# Patient Record
Sex: Male | Born: 1980 | Hispanic: Yes | Marital: Married | Smoking: Never smoker
Health system: Southern US, Community
[De-identification: ages and names within clinical notes are randomized; demographics above are authoritative.]

---

## 2014-05-18 ENCOUNTER — Encounter (HOSPITAL_COMMUNITY): Payer: Self-pay | Admitting: Family Medicine

## 2014-05-18 ENCOUNTER — Emergency Department (HOSPITAL_COMMUNITY)
Admission: EM | Admit: 2014-05-18 | Discharge: 2014-05-18 | Disposition: A | Discharge status: Home / Self Care | Payer: Self-pay | Attending: Emergency Medicine | Admitting: Emergency Medicine

## 2014-05-18 DIAGNOSIS — R1110 Vomiting, unspecified: Secondary | ICD-10-CM | POA: Insufficient documentation

## 2014-05-18 DIAGNOSIS — R197 Diarrhea, unspecified: Secondary | ICD-10-CM | POA: Insufficient documentation

## 2014-05-18 LAB — COMPREHENSIVE METABOLIC PANEL
ALT: 65 U/L — ABNORMAL HIGH (ref 0–53)
AST: 50 U/L — AB (ref 0–37)
Albumin: 4.3 g/dL (ref 3.5–5.2)
Alkaline Phosphatase: 89 U/L (ref 39–117)
Anion gap: 6 (ref 5–15)
BUN: 15 mg/dL (ref 6–23)
CALCIUM: 9.3 mg/dL (ref 8.4–10.5)
CO2: 26 mmol/L (ref 19–32)
Chloride: 105 mEq/L (ref 96–112)
Creatinine, Ser: 0.89 mg/dL (ref 0.50–1.35)
GFR calc Af Amer: >90 mL/min (ref >90)
GFR calc non Af Amer: >90 mL/min (ref >90)
Glucose, Bld: 102 mg/dL — ABNORMAL HIGH (ref 70–99)
Potassium: 4 mmol/L (ref 3.5–5.1)
SODIUM: 137 mmol/L (ref 135–145)
TOTAL PROTEIN: 7.4 g/dL (ref 6.0–8.3)
Total Bilirubin: 0.5 mg/dL (ref 0.3–1.2)

## 2014-05-18 LAB — CBC WITH DIFFERENTIAL/PLATELET
Basophils Absolute: 0 10*3/uL (ref 0.0–0.1)
Basophils Relative: 0 % (ref 0–1)
EOS ABS: 0.1 10*3/uL (ref 0.0–0.7)
EOS PCT: 2 % (ref 0–5)
HCT: 44 % (ref 39.0–52.0)
Hemoglobin: 15.3 g/dL (ref 13.0–17.0)
LYMPHS PCT: 35 % (ref 12–46)
Lymphs Abs: 1.9 10*3/uL (ref 0.7–4.0)
MCH: 32.6 pg (ref 26.0–34.0)
MCHC: 34.8 g/dL (ref 30.0–36.0)
MCV: 93.8 fL (ref 78.0–100.0)
Monocytes Absolute: 0.5 10*3/uL (ref 0.1–1.0)
Monocytes Relative: 10 % (ref 3–12)
NEUTROS PCT: 53 % (ref 43–77)
Neutro Abs: 2.9 10*3/uL (ref 1.7–7.7)
PLATELETS: 167 10*3/uL (ref 150–400)
RBC: 4.69 MIL/uL (ref 4.22–5.81)
RDW: 12.3 % (ref 11.5–15.5)
WBC: 5.4 10*3/uL (ref 4.0–10.5)

## 2014-05-18 LAB — LIPASE, BLOOD: Lipase: 37 U/L (ref 11–59)

## 2014-05-18 LAB — URINALYSIS, ROUTINE W REFLEX MICROSCOPIC
Bilirubin Urine: NEGATIVE
Glucose, UA: NEGATIVE mg/dL
Ketones, ur: NEGATIVE mg/dL
LEUKOCYTES UA: NEGATIVE
Nitrite: NEGATIVE
Protein, ur: NEGATIVE mg/dL
SPECIFIC GRAVITY, URINE: 1.023 (ref 1.005–1.030)
Urobilinogen, UA: 0.2 mg/dL (ref 0.0–1.0)
pH: 7 (ref 5.0–8.0)

## 2014-05-18 LAB — URINE MICROSCOPIC-ADD ON

## 2014-05-18 MED ORDER — MORPHINE SULFATE 4 MG/ML IJ SOLN
4.0000 mg | Freq: Once | INTRAMUSCULAR | Status: AC
  Administered 2014-05-18: 4 mg via INTRAVENOUS
  Filled 2014-05-18: qty 1

## 2014-05-18 MED ORDER — SODIUM CHLORIDE 0.9 % IV BOLUS (SEPSIS)
1000.0000 mL | Freq: Once | INTRAVENOUS | Status: AC
  Administered 2014-05-18: 1000 mL via INTRAVENOUS

## 2014-05-18 NOTE — Discharge Instructions (Signed)
Drink plenty of fluids. Call for a follow up appointment with a Family or Primary Care Provider.  Return if Symptoms worsen.   Take medication as prescribed.    Emergency Department Resource Guide 1) Find a Doctor and Pay Out of Pocket Although you won't have to find out who is covered by your insurance plan, it is a good idea to ask around and get recommendations. You will then need to call the office and see if the doctor you have chosen will accept you as a new patient and what types of options they offer for patients who are self-pay. Some doctors offer discounts or will set up payment plans for their patients who do not have insurance, but you will need to ask so you aren't surprised when you get to your appointment.  2) Contact Your Local Health Department Not all health departments have doctors that can see patients for sick visits, but many do, so it is worth a call to see if yours does. If you don't know where your local health department is, you can check in your phone book. The CDC also has a tool to help you locate your state's health department, and many state websites also have listings of all of their local health departments.  3) Find a Walk-in Clinic If your illness is not likely to be very severe or complicated, you may want to try a walk in clinic. These are popping up all over the country in pharmacies, drugstores, and shopping centers. They're usually staffed by nurse practitioners or physician assistants that have been trained to treat common illnesses and complaints. They're usually fairly quick and inexpensive. However, if you have serious medical issues or chronic medical problems, these are probably not your best option.  No Primary Care Doctor: - Call Health Connect at  [phone] - they can help you locate a primary care doctor that  accepts your insurance, provides certain services, etc. - Physician Referral Service- [phone]  Chronic Pain  Problems: Organization         Address  Phone   Notes  [facility]  [phone] Patients need to be referred by their primary care doctor.   Medication Assistance: Organization         Address  Phone   Notes  [facility] [address] [place], [place] [address] [phone]) [phone] --Must be a resident of [facility] [place] -- Must have NO insurance coverage whatsoever (no Medicaid/ Medicare, etc.) -- The pt. MUST have a primary care doctor that directs their care regularly and follows them in the community   MedAssist  [phone]   [employer]  [phone]) [phone]    Agencies that provide inexpensive medical care: Organization         Address  Phone   Notes  [facility]  [phone]   [facility]    [phone]) [phone]   [facility] [address] [place], [place] [address] [phone]) [phone]   [facility] [place] [address], [place] [phone]   [facility]    [phone](336) [phone]   [facility]    [phone](336) [phone]   [facility]  [address],  [place] Phone:  [phone], Fax:  [phone] Hours of Operation:  9 am - 6 pm, M-F.  Also accepts Medicaid/Medicare and self-pay.  [facility]  [address]  [place], [place], [place] Phone: [phone], Fax: [phone]. Hours of Operation:  8:30 am - 5:30 pm, M-F.  Also accepts Medicaid and self-pay.  [facility] [place] [address], [place] Phone: [phone]   [place], [place], [place] [phone], Ext. [phone] Mondays & Thursdays: 7-9 AM.  First 15 patients are seen on a first come, first serve basis.    [place] Providers:  Organization         Address  Phone   Notes  [facility] [address], [place] [phone] Also  accepts self-pay patients.  [facility] [address], [place]  [phone]   [place], [address], [place] [phone]   [facility] [address] [place] [phone]   Lucianne Lei [address], [place]   [phone] Only accepts [place] Access [place] patients after they have their name applied to their card.   Self-Pay (no insurance) in [facility]:  Organization         Address  Phone   Notes  Sickle Cell Patients, [facility] [place] [phone]   [facility] [place] [phone]   [facility] [place]  [place], [place],  [phone]   [facility]/Dr. Osei-Bonsu  [address] [place] or [address], [place] [phone] Phone number for both [place] and [place] locations is the same.  [facility] [address], [place] [phone]   [facility] [address] [place] or [address] [phone]   [facility] [address], [place] [phone], phone; [phone], fax Sees patients 1st and 3rd Saturday of every month.  Must not qualify for public or private insurance (i.e. Medicaid, Medicare, [place] Health Choice, Veterans' Benefits)  Household income should be no more than 200% of the poverty level The clinic cannot treat you if you are pregnant or think you are pregnant  Sexually transmitted diseases are not treated at the clinic.    Dental Care: Organization         Address  Phone  Notes  [facility] [place] [phone] Accepts children up to age 34 who are enrolled in [place] or [place]; pregnant  women with a Medicaid card; and children who have applied for Medicaid or [place] Health Choice, but were declined, whose parents can pay a reduced fee at time of service.  [facility]  [address], [place] [phone] Accepts children up to age 24 who are enrolled in [place] or [place]; pregnant women with a Medicaid card; and children who have applied for Medicaid or [place] Health Choice, but were declined, whose parents can pay a reduced fee at time of service.  [facility]  [place] [phone] Patients are seen by appointment only. Walk-ins are not accepted. [place] will see patients 57 years of age and older. Monday - Tuesday (8am-5pm) Most Wednesdays (8:30-5pm) $30 per visit, cash only  [place]  [address], [place] [phone] Patients are seen by appointment only. Walk-ins are not accepted. [facility] will see patients 18 years of age and older. °One Wednesday Evening (Monthly: Volunteer Based).  $30 per visit, cash only  °[facility]  [phone] for adults; Children under age 4, call Graduate Pediatric Dentistry at [phone]. Children aged 4-14, please call [phone] to request a pediatric application. ° Dental services are provided in all areas of dental care including fillings, crowns and bridges, complete and partial dentures, implants, gum treatment, root canals, and extractions. Preventive care is also provided. Treatment is provided to both adults and children. °Patients are selected via a lottery and there is often a waiting list. °  °[facility] [address], °[place] ° ([phone] [email or web address] °  °[facility] [address], [place], [place] [phone], Ext. [phone] Second and Fourth Thursday of each month, opens at 6:30 AM; Clinic ends at 9 AM.  Patients are  seen on a first-come first-served basis, and a limited number are seen during each clinic.  ° °[facility] ° [address], [place], [place] [phone]   Eligibility Requirements °You must have lived in [place], [place], or [place] counties for at least the last three months. °  You cannot be eligible for state or federal sponsored healthcare insurance, including Veterans Administration, Medicaid, or Medicare. °  You generally cannot be eligible for healthcare insurance through your employer.  °  How to apply: °Eligibility screenings are held every Tuesday and Wednesday afternoon from 1:00 pm until 4:00 pm. You do not need an appointment for the interview!  °[facility] [address], [place], [place] [phone]   °[place] [facility]  [phone]   °[place] [facility]  [phone]   °[facility]  336-570-6415   ° °Behavioral Health Resources in the Community: °Intensive Outpatient Programs °Organization         Address  Phone  Notes  °[facility] [address], [place], [place] [phone]   °[facility] [address], [place], [place] [phone]   °[facility] [address], [place], [place] ° 336-882-2125   °[facility] [address],  °[place], [place] 1-800-853-5163 or 336-641-4981   °Substance Abuse Resources °Organization         Address  Phone  Notes  °[facility]  336-882-2125   °[facility]  336-784-9470   °[facility]  336-285-9073   °[facility]  336-845-3988   °Residential & Outpatient Substance Abuse Program  1-800-659-3381   °Psychological Services °Organization         Address  Phone  Notes  ° Health  336- 832-9600   °[facility]  336- 378-7881   °[facility] [address], [place] 1-800-853-5163 or 336-641-4981   ° °Mobile Crisis  Teams °Organization         Address  Phone  Notes  °[facility], Mobile Crisis Care Unit  1-877-626-1772   °Assertive °Psychotherapeutic Services ° [address] [place], [place] 336-834-9664   °Sharon DeEsch [address] °[place] 336-554-5454   ° °Self-Help/Support Groups °Organization         Address  Phone             Notes  °[facility] [place] -   variety of support groups  336- (248) 710-8350 Call for more information  Narcotics Anonymous (NA), [facility] [address], [place]  2 meetings at this location   Residential Sports administratorTreatment Programs Organization         Address  Phone  Notes  [facility] [address],    [place]  [phone]   [facility]  [address], [place] [phone], [place], [place] [phone]   [facility] [address], [place] [phone] Admissions: 8am-3pm M-F  [facility] [address]    [place], [place] [phone]   [facility] [address], [place], [place] [phone]   [facility] [address]  [place], [place] [phone]   [facility] - Intensive Outpatient [address], [place], [place] [phone]   [facility] ([facility] [address]  [place], [place] [phone] or [phone]   [facility] ([facility]) [address] [place], [place] [phone] Accepts Medicaid  [facility] [address]  [place] [phone] Substance Abuse/Addiction Treatment   [facility] Resources Organization         Address  Phone  Notes  [facility]  [phone]) [phone]   Angie FavaJulie Brannon, PhD [address], [address] [place], [place]   [phone](336) [phone] or [phone](336) [phone]   [facility]   [address] [place], [place] [phone]) [phone]   [facility] [address], [place], [place] [phone](336) [phone]  Insurance/Medicaid/sponsorship through [facility]  [facility] [address]                                    [place], [place] [phone](336) [phone] Therapy/tele-psych/case  [facility] [address]   [place], [place] [phone](336) [phone]    Dr. Lolly MustacheArfeen  [phone](336) [phone]   [facility]  [employer] [facility] 1) [address],  2) [address], [place] 3)  [address], [place] [phone](336) [phone]) [phone]  [phone]   [facility] Child Abuse Hotline [phone](336) [phone] or [phone](336) [phone] (After Hours)

## 2014-05-18 NOTE — ED Provider Notes (Signed)
CSN: [id number]     Arrival date & time 05/18/14  0935 History   First MD Initiated Contact with Patient 05/18/14 1131     Chief Complaint  Patient presents with  . Abdominal Pain  . Diarrhea     (Consider location/radiation/quality/duration/timing/severity/associated sxs/prior Treatment) HPI Comments: The patient is a 33 year old male presents emergency room chief complaint of persistent diarrhea for 3 days. Patient reports associated abdominal cramping, mid, non radiating, bilateral. Reports 10 episodes of diarrhea over the last 3 days. He reports one episode of vomiting 3 days ago. He denies fever, chills, recent travel, recent antibiotic. No known sick contacts.  Tolerating fluids and solids.  The history is provided by the patient. No language interpreter was used.    History reviewed. No pertinent past medical history. History reviewed. No pertinent past surgical history. History reviewed. No pertinent family history. History  Substance Use Topics  . Smoking status: Never Smoker   . Smokeless tobacco: Never Used  . Alcohol Use: No    Review of Systems  Constitutional: Negative for fever and chills.  Gastrointestinal: Positive for vomiting and diarrhea. Negative for abdominal pain and abdominal distention.  Genitourinary: Negative for dysuria and hematuria.      Allergies  Review of patient's allergies indicates no known allergies.  Home Medications   Prior to Admission medications   Not on File   BP 130/81 mmHg  Pulse 60  Temp(Src) 97.8 F (36.6 C) (Oral)  Resp 18  Wt 158 lb (71.668 kg)  SpO2 99% Physical Exam  Constitutional: He is oriented to person, place, and time. He appears well-nourished. No distress.  Eyes: No scleral icterus.  Neck: Neck supple.  Cardiovascular: Normal rate and regular rhythm.   Pulmonary/Chest: Effort normal and breath sounds normal. No respiratory distress. He has no wheezes. He has no rales.  Abdominal: Soft. Bowel sounds are  normal. There is no rigidity, no rebound, no guarding and no CVA tenderness.  Minimal Mid abdominal discomfort.  Musculoskeletal: Normal range of motion.  Neurological: He is alert and oriented to person, place, and time.  Skin: Skin is warm and dry.  Psychiatric: He has a normal mood and affect. His behavior is normal.  Nursing note and vitals reviewed.   ED Course  Procedures (including critical care time) Labs Review Labs Reviewed  COMPREHENSIVE METABOLIC PANEL - Abnormal; Notable for the following:    Glucose, Bld 102 (*)    AST 50 (*)    ALT 65 (*)    All other components within normal limits  URINALYSIS, ROUTINE W REFLEX MICROSCOPIC - Abnormal; Notable for the following:    Hgb urine dipstick TRACE (*)    All other components within normal limits  CBC WITH DIFFERENTIAL  LIPASE, BLOOD  URINE MICROSCOPIC-ADD ON    Imaging Review No results found.   EKG Interpretation None      MDM   Final diagnoses:  Diarrhea   Patient with mild abdominal discomfort, No rebound, guarding, with 2 day history of diarrhea. Likely viral. Patient afebrile. CMP with mild transaminase elevation, UA with mild hemoglobin on dipstick. No signs of dehydration on lab work or exam. Reevaluation patient reports moderate resolution of symptoms after medication. Discussed lab results, and treatment plan with the patient. Return precautions given. Reports understanding and no other concerns at this time.  Patient is stable for discharge at this time. Meds given in ED:  Medications  sodium chloride 0.9 % bolus 1,000 mL (0 mLs Intravenous Stopped 05/18/14 1331)  morphine 4 MG/ML injection 4 mg (4 mg Intravenous Given 05/18/14 1214)    There are no discharge medications for this patient.   Mellody DrownLauren Tanja Gift, PA-C 05/18/14 1530  Toy BakerAnthony T Allen, MD 05/19/14 [phone]

## 2014-05-18 NOTE — ED Notes (Signed)
Pt having diarrhea and vomiting for a few days. sts abd bloating. sts urine is dark and worried he is dehydrated.

## 2015-01-17 ENCOUNTER — Encounter (HOSPITAL_COMMUNITY): Payer: Self-pay | Admitting: *Deleted

## 2015-01-17 ENCOUNTER — Emergency Department (HOSPITAL_COMMUNITY)
Admission: EM | Admit: 2015-01-17 | Discharge: 2015-01-17 | Disposition: A | Discharge status: Home / Self Care | Payer: Self-pay | Attending: Emergency Medicine | Admitting: Emergency Medicine

## 2015-01-17 DIAGNOSIS — R1032 Left lower quadrant pain: Secondary | ICD-10-CM | POA: Insufficient documentation

## 2015-01-17 DIAGNOSIS — Z79899 Other long term (current) drug therapy: Secondary | ICD-10-CM | POA: Insufficient documentation

## 2015-01-17 LAB — COMPREHENSIVE METABOLIC PANEL
ALT: 18 U/L (ref 17–63)
ANION GAP: 8 (ref 5–15)
AST: 33 U/L (ref 15–41)
Albumin: 4.1 g/dL (ref 3.5–5.0)
Alkaline Phosphatase: 75 U/L (ref 38–126)
BUN: 12 mg/dL (ref 6–20)
CO2: 23 mmol/L (ref 22–32)
Calcium: 9.3 mg/dL (ref 8.9–10.3)
Chloride: 109 mmol/L (ref 101–111)
Creatinine, Ser: 0.85 mg/dL (ref 0.61–1.24)
GFR calc Af Amer: >60 mL/min (ref >60)
GFR calc non Af Amer: >60 mL/min (ref >60)
GLUCOSE: 115 mg/dL — AB (ref 65–99)
POTASSIUM: 3.5 mmol/L (ref 3.5–5.1)
SODIUM: 140 mmol/L (ref 135–145)
Total Bilirubin: 0.6 mg/dL (ref 0.3–1.2)
Total Protein: 7.1 g/dL (ref 6.5–8.1)

## 2015-01-17 LAB — URINALYSIS, ROUTINE W REFLEX MICROSCOPIC
Bilirubin Urine: NEGATIVE
GLUCOSE, UA: NEGATIVE mg/dL
HGB URINE DIPSTICK: NEGATIVE
Ketones, ur: NEGATIVE mg/dL
Leukocytes, UA: NEGATIVE
Nitrite: NEGATIVE
Protein, ur: NEGATIVE mg/dL
SPECIFIC GRAVITY, URINE: 1.024 (ref 1.005–1.030)
Urobilinogen, UA: 0.2 mg/dL (ref 0.0–1.0)
pH: 6.5 (ref 5.0–8.0)

## 2015-01-17 LAB — CBC
HEMATOCRIT: 42.1 % (ref 39.0–52.0)
HEMOGLOBIN: 14.5 g/dL (ref 13.0–17.0)
MCH: 32.2 pg (ref 26.0–34.0)
MCHC: 34.4 g/dL (ref 30.0–36.0)
MCV: 93.6 fL (ref 78.0–100.0)
Platelets: 194 10*3/uL (ref 150–400)
RBC: 4.5 MIL/uL (ref 4.22–5.81)
RDW: 12.5 % (ref 11.5–15.5)
WBC: 6.1 10*3/uL (ref 4.0–10.5)

## 2015-01-17 LAB — LIPASE, BLOOD: Lipase: 41 U/L (ref 22–51)

## 2015-01-17 MED ORDER — OXYCODONE-ACETAMINOPHEN 5-325 MG PO TABS
1.0000 | ORAL_TABLET | Freq: Once | ORAL | Status: AC
  Administered 2015-01-17: 1 via ORAL

## 2015-01-17 MED ORDER — OXYCODONE-ACETAMINOPHEN 5-325 MG PO TABS
ORAL_TABLET | ORAL | Status: DC
Start: 2015-01-17 — End: 2015-01-17
  Filled 2015-01-17: qty 1

## 2015-01-17 MED ORDER — IBUPROFEN 200 MG PO TABS
600.0000 mg | ORAL_TABLET | Freq: Once | ORAL | Status: AC
  Administered 2015-01-17: 600 mg via ORAL
  Filled 2015-01-17: qty 3

## 2015-01-17 NOTE — Discharge Instructions (Signed)
Your abdominal pain seems more musculoskeletal in nature. I continue to take ibuprofen 600 mg every 6 hours as needed for pain control. Take with food and crackers as it may upset her stomach. Return without fail for worsening symptoms including fevers, vomiting unable to keep down food or fluids, worsening pain, or any other symptoms concerning to you.   Dolor abdominal (Abdominal Pain) El dolor puede tener muchas causas. Normalmente la causa del dolor abdominal no es una enfermedad y Scientist, clinical (histocompatibility and immunogenetics) sin TEFL teacher. Frecuentemente puede controlarse y tratarse en casa. Su mdico le Medical sales representative examen fsico y posiblemente solicite anlisis de sangre y radiografas para ayudar a Chief Strategy Officer la gravedad de su dolor. Sin embargo, en [employer], debe transcurrir ms tiempo antes de que se pueda Clinical research associate una causa evidente del dolor. Antes de llegar a ese punto, es posible que su mdico no sepa si necesita ms pruebas o un tratamiento ms profundo. INSTRUCCIONES PARA EL CUIDADO EN EL HOGAR  Est atento al dolor para ver si hay cambios. Las siguientes indicaciones ayudarn a Architectural technologist que pueda sentir:  [place] solo medicamentos de venta libre o recetados, segn las indicaciones del mdico.  No tome laxantes a menos que se lo haya indicado su mdico.  Pruebe con Neomia Dear dieta lquida absoluta (caldo, t o agua) segn se lo indique su mdico. Introduzca gradualmente una dieta normal, segn su tolerancia. SOLICITE ATENCIN MDICA SI:  Tiene dolor abdominal sin explicacin.  Tiene dolor abdominal relacionado con nuseas o diarrea.  Tiene dolor cuando orina o defeca.  Experimenta dolor abdominal que lo despierta de noche.  Tiene dolor abdominal que empeora o mejora cuando come alimentos.  Tiene dolor abdominal que empeora cuando come alimentos grasosos.  Tiene fiebre. SOLICITE ATENCIN MDICA DE INMEDIATO SI:   El dolor no desaparece en un plazo mximo de 2horas.  No deja de  (vomitar).  El Engineer, mining se siente solo en partes del abdomen, como el lado derecho o la parte inferior izquierda del abdomen.  Evaca materia fecal sanguinolenta o negra, de aspecto alquitranado. ASEGRESE DE QUE:  Comprende estas instrucciones.  Controlar su afeccin.  Recibir ayuda de inmediato si no mejora o si empeora. Document Released: 05/10/2005 Document Revised: 05/15/2013 [facility] Patient Information 2015 [place], [place]. This information is not intended to replace advice given to you by your health care provider. Make sure you discuss any questions you have with your health care provider.    Emergency Department Resource Guide 1) Find a Doctor and Pay Out of Pocket Although you won't have to find out who is covered by your insurance plan, it is a good idea to ask around and get recommendations. You will then need to call the office and see if the doctor you have chosen will accept you as a new patient and what types of options they offer for patients who are self-pay. Some doctors offer discounts or will set up payment plans for their patients who do not have insurance, but you will need to ask so you aren't surprised when you get to your appointment.  2) Contact Your Local Health Department Not all health departments have doctors that can see patients for sick visits, but many do, so it is worth a call to see if yours does. If you don't know where your local health department is, you can check in your phone book. The CDC also has a tool to help you locate your state's health department, and many state websites also have listings of all of  their local health departments.  3) Find a Walk-in Clinic If your illness is not likely to be very severe or complicated, you may want to try a walk in clinic. These are popping up all over the country in pharmacies, drugstores, and shopping centers. They're usually staffed by nurse practitioners or physician assistants that have been trained to  treat common illnesses and complaints. They're usually fairly quick and inexpensive. However, if you have serious medical issues or chronic medical problems, these are probably not your best option.  No Primary Care Doctor: - Call Health Connect at  [phone] - they can help you locate a primary care doctor that  accepts your insurance, provides certain services, etc. - Physician Referral Service- [phone]  Chronic Pain Problems: Organization         Address  Phone   Notes  [facility]  [phone] Patients need to be referred by their primary care doctor.   Medication Assistance: Organization         Address  Phone   Notes  [facility] Medication [facility] [address] [place] [place], [place] [address] [phone] --Must be a resident of [facility] -- Must have NO insurance coverage whatsoever (no Medicaid/ Medicare, etc.) -- The pt. MUST have a primary care doctor that directs their care regularly and follows them in the community   [facility]  [phone]   [employer]  [phone]    Agencies that provide inexpensive medical care: Organization         Address  Phone   Notes  [facility]  [phone]   [facility]    [phone]   [facility] [address] [place], [place] [address] [phone]   [facility] [address] [place] [phone]   [facility]    [phone]   [facility]    [phone]   [facility]  [address], [place] Phone:  [phone], Fax:  [phone] Hours of Operation:  9 am - 6 pm, M-F.  Also accepts Medicaid/Medicare and self-pay.  [facility]  [address], [place] Phone: [phone], Fax: [phone]. Hours of Operation:  8:30 am - 5:30 pm, M-F.  Also accepts Medicaid and self-pay.  [facility]  [place] [address] [place] Phone: [phone]   [facility] [address] Natasha Bence [place], [place] [phone], Ext. [phone] Mondays & Thursdays: 7-9 AM.  First 15 patients are seen on a first come, first serve basis.    Medicaid-accepting [facility] Providers:  Organization         Address  Phone   Notes  [facility] [address], [place] [phone] Also accepts self-pay patients.  [facility] [address] Laurell Josephs [place], [place]  [phone]   [facility] [address], [place] [phone]   [facility] [address] [place] [phone]   Renaye Rakers [address], [place]   [phone] Only accepts [place] patients after they have their name applied to their card.   Self-Pay (no insurance) in [facility]:  Organization         Address  Phone   Notes  Sickle Cell  Patients, [facility] [address] [place], [place] [phone]   [facility] [address] [place], [place] [phone]   [facility]  [address],  [phone]   [facility]/Dr. Osei-Bonsu  [address], [place] or [address], [place] [phone] Phone number for both [place] and [place] locations is the same.  [facility] [address] [place] [phone]   [facility] [address] [place] or [address] [phone]   [facility] [address], [place] [phone], phone; [phone], fax Sees patients 1st and 3rd Saturday of every month.  Must not qualify for public or private insurance (i.e. Medicaid, Medicare, [place] Health Choice, Veterans' Benefits)  Household income should be no more than 200%  of the poverty level The clinic cannot treat you if you are pregnant or think you are pregnant  Sexually transmitted diseases are not treated at the clinic.    Dental Care: Organization         Address  Phone  Notes  [facility] of [facility] [address] [place], [place] [phone] Accepts children up to age 99 who are enrolled in [place] or [place] Health Choice; pregnant women with a Medicaid card; and children who have applied for Medicaid or [place] Health Choice, but were declined, whose parents can pay a reduced fee at time of service.  [facility]  [address], [place] [phone] Accepts children up to age 29 who are enrolled in [place] or [place] Health Choice; pregnant women with a Medicaid card; and children who have applied for Medicaid or [place] Health Choice, but were declined, whose parents can pay a reduced fee at time of service.  [facility]  [address] [place], [place] [phone] Patients are seen by appointment only. Walk-ins are not accepted. [facility] will see patients 30 years of age and older. Monday - Tuesday (8am-5pm) Most Wednesdays (8:30-5pm) $30 per visit, cash only  [facility]  [address], [facility] [phone] Patients are seen by appointment only. Walk-ins are not accepted. [facility] will see patients 78 years of age and older. One Wednesday Evening (Monthly: Volunteer Based).  $30 per visit, cash only  [employer]  [phone] for adults; Children under age 48, call Graduate Pediatric Dentistry at [phone]. Children aged 41-14, please call [phone] to request a pediatric application.  Dental services are provided in all areas of dental care including fillings, crowns and bridges, complete and partial dentures, implants, gum treatment, root canals, and  extractions. Preventive care is also provided. Treatment is provided to both adults and children. Patients are selected via a lottery and there is often a waiting list.   [facility] [address] [place]  [phone] [email or web address]   [facility] [address] [place], [place] [phone], Ext. [phone] Second and Fourth Thursday of each month, opens at 6:30 AM; Clinic ends at 9 AM.  Patients are seen on a first-come first-served basis, and a limited number are seen during each clinic.   [facility]  [address] Ether Griffins [place], [place] [phone]   Eligibility Requirements You  must have lived in [place], [place], or [place] counties for at least the last three months.   You cannot be eligible for state or federal sponsored [place], including [employer], [place], or [employer].   You generally cannot be eligible for healthcare insurance through your employer.    How to apply: Eligibility screenings are held every Tuesday and Wednesday afternoon from 1:00 pm until 4:00 pm. You do not need an appointment for the interview!  [facility] [address] [place], [place] [phone]   [facility] Health Department  [phone]   [facility] Health Department  [phone]   [facility] Health Department  [phone]

## 2015-01-17 NOTE — ED Provider Notes (Signed)
CSN: [id number]     Arrival date & time 01/17/15  1438 History   First MD Initiated Contact with Patient 01/17/15 1710     Chief Complaint  Patient presents with  . Abdominal Pain     (Consider location/radiation/quality/duration/timing/severity/associated sxs/prior Treatment) HPI 34 year old male who presents with LLQ abdominal pain. Otherwise healthy, and no prior abdominal surgeries. Developed LLQ pain, throbbing and burning in nature and non-radiating this morning. Never had pain like this before. He notices pain only with movement of his abdomen, especially bending over. He denies having any pain at rest. 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 Denies nausea, vomiting, fevers, chills, diarrhea, dysuria, urinary frequency, melena, or hematochezia.    History reviewed. No pertinent past medical history. History reviewed. No pertinent past surgical history. History reviewed. No pertinent family history. Social History  Substance Use Topics  . Smoking status: Never Smoker   . Smokeless tobacco: Never Used  . Alcohol Use: No    Review of Systems 10/14 systems reviewed and are negative other than those stated in the HPI  Allergies  Review of patient's allergies indicates no known allergies.  Home Medications   Prior to Admission medications   Medication Sig Start Date End Date Taking? Authorizing Provider  acetaminophen (TYLENOL) 500 MG tablet Take 500 mg by mouth every 6 (six) hours as needed for mild pain or moderate pain.   Yes Historical Provider, MD  bismuth subsalicylate (PEPTO BISMOL) 262 MG/15ML suspension Take 30 mLs by mouth every 6 (six) hours as needed for indigestion.   Yes Historical Provider, MD  Cyanocobalamin (VITAMIN B 12 PO) Take 1 tablet by mouth daily at 12 noon.   Yes Historical Provider, MD  finasteride (PROPECIA) 1 MG tablet Take 1 mg by mouth daily.   Yes Historical Provider, MD  FOLIC ACID PO Take  1 tablet by mouth daily at 12 noon.   Yes Historical Provider, MD   BP 118/77 mmHg  Pulse 61  Temp(Src) 98.3 F (36.8 C) (Oral)  Resp 12  SpO2 97% Physical Exam Physical Exam  Nursing note and vitals reviewed. Constitutional: Well developed, well nourished, non-toxic, and in no acute distress Head: Normocephalic and atraumatic.  Mouth/Throat: Oropharynx is clear and moist.  Neck: Normal range of motion. Neck supple.  Cardiovascular: Normal rate and regular rhythm.   Pulmonary/Chest: Effort normal and breath sounds normal.  Abdominal: Soft. There is minimal LLQ tenderness. There is no rebound and no guarding. no CVA tenderness Musculoskeletal: Normal range of motion.  Neurological: Alert, no facial droop, fluent speech, moves all extremities symmetrically Skin: Skin is warm and dry.  Psychiatric: Cooperative  ED Course  Procedures (including critical care time) Labs Review Labs Reviewed  COMPREHENSIVE METABOLIC PANEL - Abnormal; Notable for the following:    Glucose, Bld 115 (*)    All other components within normal limits  LIPASE, BLOOD  CBC  URINALYSIS, ROUTINE W REFLEX MICROSCOPIC (NOT AT [facility])   I have personally reviewed and evaluated these images and lab results as part of my medical decision-making.   MDM   Final diagnoses:  Left lower quadrant pain    34 year old male, otherwise healthy, who presents with left lower quadrant abdominal pain in the setting of exertional activity. He is well-appearing, nontoxic, in no acute distress on presentation. Vital signs are within normal limits on arrival. He has a soft and nonsurgical abdomen. Without movement, he denies having any abdominal pain. However sitting up  on the ED gurney and twisting his body around, he does report reproduction of his pain. No prior abdominal surgeries, and I cannot palpate hernia on exam. Overall I do feel that his abdominal pain is musculoskeletal in nature. it seems less likely that symptoms may  be related to other etiology such as diverticulitis, hernia, or other acute intra-abdominal process, given pain only reproduced with movement. Basic blood work including CBC, comprehensive metabolic profile, lipase, and  urinalysis are unremarkable. He is given  analgesics in the ED, to good effect. I've discussed supportive care with this patient for home. He is given strict return and follow-up instructions. He expressed understanding of all discharge instructions for comfortable to plan of care.   Lavera Guise, MD 01/18/15 [phone]

## 2015-01-17 NOTE — ED Notes (Signed)
Pt reports waking up this am with LLQ pain, bent over an hour ago to pick something up and increase in pain and describes it as a burning pain. Denies any n/v/d or urinary symptoms.

## 2016-12-17 ENCOUNTER — Emergency Department (HOSPITAL_COMMUNITY)
Admission: EM | Admit: 2016-12-17 | Discharge: 2016-12-17 | Disposition: A | Discharge status: Home / Self Care | Payer: Self-pay | Attending: Emergency Medicine | Admitting: Emergency Medicine

## 2016-12-17 ENCOUNTER — Encounter (HOSPITAL_COMMUNITY): Payer: Self-pay | Admitting: *Deleted

## 2016-12-17 ENCOUNTER — Emergency Department (HOSPITAL_COMMUNITY): Payer: Self-pay

## 2016-12-17 DIAGNOSIS — R319 Hematuria, unspecified: Secondary | ICD-10-CM | POA: Insufficient documentation

## 2016-12-17 DIAGNOSIS — R509 Fever, unspecified: Secondary | ICD-10-CM

## 2016-12-17 DIAGNOSIS — J020 Streptococcal pharyngitis: Secondary | ICD-10-CM | POA: Insufficient documentation

## 2016-12-17 LAB — URINALYSIS, ROUTINE W REFLEX MICROSCOPIC
BACTERIA UA: NONE SEEN
Bilirubin Urine: NEGATIVE
GLUCOSE, UA: NEGATIVE mg/dL
Ketones, ur: NEGATIVE mg/dL
Leukocytes, UA: NEGATIVE
NITRITE: NEGATIVE
PROTEIN: 30 mg/dL — AB
Specific Gravity, Urine: 1.029 (ref 1.005–1.030)
Squamous Epithelial / LPF: NONE SEEN
pH: 6 (ref 5.0–8.0)

## 2016-12-17 LAB — CBC WITH DIFFERENTIAL/PLATELET
BASOS ABS: 0 10*3/uL (ref 0.0–0.1)
BASOS PCT: 0 %
Eosinophils Absolute: 0 10*3/uL (ref 0.0–0.7)
Eosinophils Relative: 0 %
HCT: 42 % (ref 39.0–52.0)
HEMOGLOBIN: 14 g/dL (ref 13.0–17.0)
Lymphocytes Relative: 10 %
Lymphs Abs: 1.4 10*3/uL (ref 0.7–4.0)
MCH: 31.5 pg (ref 26.0–34.0)
MCHC: 33.3 g/dL (ref 30.0–36.0)
MCV: 94.4 fL (ref 78.0–100.0)
MONOS PCT: 7 %
Monocytes Absolute: 1 10*3/uL (ref 0.1–1.0)
NEUTROS ABS: 11.6 10*3/uL — AB (ref 1.7–7.7)
NEUTROS PCT: 83 %
Platelets: 159 10*3/uL (ref 150–400)
RBC: 4.45 MIL/uL (ref 4.22–5.81)
RDW: 12.5 % (ref 11.5–15.5)
WBC: 13.9 10*3/uL — AB (ref 4.0–10.5)

## 2016-12-17 LAB — BASIC METABOLIC PANEL
ANION GAP: 7 (ref 5–15)
BUN: 10 mg/dL (ref 6–20)
CALCIUM: 9 mg/dL (ref 8.9–10.3)
CO2: 25 mmol/L (ref 22–32)
Chloride: 102 mmol/L (ref 101–111)
Creatinine, Ser: 0.99 mg/dL (ref 0.61–1.24)
Glucose, Bld: 123 mg/dL — ABNORMAL HIGH (ref 65–99)
Potassium: 3.4 mmol/L — ABNORMAL LOW (ref 3.5–5.1)
Sodium: 134 mmol/L — ABNORMAL LOW (ref 135–145)

## 2016-12-17 LAB — RAPID STREP SCREEN (MED CTR MEBANE ONLY): STREPTOCOCCUS, GROUP A SCREEN (DIRECT): POSITIVE — AB

## 2016-12-17 MED ORDER — IBUPROFEN 600 MG PO TABS: 600.0000 mg | ORAL_TABLET | Freq: Four times a day (QID) | ORAL | 0 refills | Status: AC | PRN

## 2016-12-17 MED ORDER — SODIUM CHLORIDE 0.9 % IV BOLUS (SEPSIS)
1000.0000 mL | Freq: Once | INTRAVENOUS | Status: AC
  Administered 2016-12-17: 1000 mL via INTRAVENOUS

## 2016-12-17 MED ORDER — PENICILLIN G BENZATHINE 1200000 UNIT/2ML IM SUSP
1.2000 10*6.[IU] | Freq: Once | INTRAMUSCULAR | Status: AC
  Administered 2016-12-17: 1.2 10*6.[IU] via INTRAMUSCULAR
  Filled 2016-12-17: qty 2

## 2016-12-17 MED ORDER — KETOROLAC TROMETHAMINE 30 MG/ML IJ SOLN
30.0000 mg | Freq: Once | INTRAMUSCULAR | Status: AC
  Administered 2016-12-17: 30 mg via INTRAVENOUS
  Filled 2016-12-17: qty 1

## 2016-12-17 MED ORDER — ACETAMINOPHEN 325 MG PO TABS
ORAL_TABLET | ORAL | Status: AC
  Filled 2016-12-17: qty 2

## 2016-12-17 MED ORDER — HYDROCODONE-ACETAMINOPHEN 5-325 MG PO TABS: 1.0000 | ORAL_TABLET | ORAL | 0 refills | Status: AC | PRN

## 2016-12-17 MED ORDER — ACETAMINOPHEN 325 MG PO TABS
650.0000 mg | ORAL_TABLET | Freq: Once | ORAL | Status: AC | PRN
  Administered 2016-12-17: 650 mg via ORAL

## 2016-12-17 NOTE — ED Notes (Signed)
Patient is resting with family at bedside  

## 2016-12-17 NOTE — ED Provider Notes (Signed)
[facility]EM[facility] DEPT Provider Note   CSN: [id number] Arrival date & time: 12/17/16  0849     History   Chief Complaint Chief Complaint  Patient presents with  . Fever    HPI Warren Brennan is a 36 y.o. male.  Pt presents to the ED today with a fever and body aches.  Fever started yesterday.  He had some diarrhea yesterday.  The pt had a fever up to 105 according to the wife.  The pt did have tylenol which did not seem to help the fever.  This morning, the pt continued with a fever and body aches and headache, so his wife brought him here.  His son has strep, but otherwise, no sick contacts.      History reviewed. No pertinent past medical history.  There are no active problems to display for this patient.   History reviewed. No pertinent surgical history.     Home Medications    Prior to Admission medications   Medication Sig Start Date End Date Taking? Authorizing Provider  acetaminophen (TYLENOL) 500 MG tablet Take 1,000 mg by mouth every 6 (six) hours as needed for mild pain or moderate pain.    Yes [provider]  bismuth subsalicylate (PEPTO BISMOL) 262 MG/15ML suspension Take 30 mLs by mouth every 6 (six) hours as needed for indigestion.    [provider]  HYDROcodone-acetaminophen (NORCO/VICODIN) 5-325 MG tablet Take 1 tablet by mouth every 4 (four) hours as needed. 12/17/16   Jacalyn LefevreHaviland, Ikran Patman, MD  ibuprofen (ADVIL,MOTRIN) 600 MG tablet Take 1 tablet (600 mg total) by mouth every 6 (six) hours as needed. 12/17/16   Jacalyn LefevreHaviland, Pranish Akhavan, MD    Family History No family history on file.  Social History Social History  Substance Use Topics  . Smoking status: Never Smoker  . Smokeless tobacco: Never Used  . Alcohol use No     Allergies   Patient has no known allergies.   Review of Systems Review of Systems  Constitutional: Positive for fatigue and fever.  HENT: Positive for sore throat.   Gastrointestinal: Positive for diarrhea.    Neurological: Positive for headaches.  All other systems reviewed and are negative.    Physical Exam Updated Vital Signs BP 108/63 (BP Location: Left Arm)   Pulse 83   Temp 98.8 F (37.1 C) (Oral)   Resp 18   Ht 5\' 4"  (1.626 m)   Wt 72.6 kg (160 lb)   SpO2 97%   BMI 27.46 kg/m   Physical Exam  Constitutional: He is oriented to person, place, and time. He appears well-developed and well-nourished.  HENT:  Head: Normocephalic and atraumatic.  Right Ear: External ear normal.  Left Ear: External ear normal.  Nose: Nose normal.  Mouth/Throat: Oropharynx is clear and moist.  Eyes: Pupils are equal, round, and reactive to light. Conjunctivae and EOM are normal.  Neck: Normal range of motion. Neck supple.  Cardiovascular: Normal rate, regular rhythm, normal heart sounds and intact distal pulses.   Pulmonary/Chest: Effort normal and breath sounds normal.  Abdominal: Soft. Bowel sounds are normal.  Musculoskeletal: Normal range of motion.  Neurological: He is alert and oriented to person, place, and time.  Skin: Skin is warm and dry.  Psychiatric: He has a normal mood and affect. His behavior is normal. Judgment and thought content normal.  Nursing note and vitals reviewed.    ED Treatments / Results  Labs (all labs ordered are listed, but only abnormal results are displayed) Labs Reviewed  RAPID STREP SCREEN (NOT AT [facility]) - Abnormal; Notable for the following:       Result Value   Streptococcus, Group A Screen (Direct) POSITIVE (*)    All other components within normal limits  BASIC METABOLIC PANEL - Abnormal; Notable for the following:    Sodium 134 (*)    Potassium 3.4 (*)    Glucose, Bld 123 (*)    All other components within normal limits  CBC WITH DIFFERENTIAL/PLATELET - Abnormal; Notable for the following:    WBC 13.9 (*)    Neutro Abs 11.6 (*)    All other components within normal limits  URINALYSIS, ROUTINE W REFLEX MICROSCOPIC - Abnormal; Notable for the  following:    Hgb urine dipstick SMALL (*)    Protein, ur 30 (*)    All other components within normal limits    EKG  EKG Interpretation None       Radiology Dg Chest 2 View  Result Date: 12/17/2016 CLINICAL DATA:  36 year old male with a history of body aches and fever EXAM: CHEST  2 VIEW COMPARISON:  None. FINDINGS: Cardiomediastinal silhouette within normal limits. No pneumothorax or pleural effusion. No confluent airspace disease. Low lung volumes with linear opacity in the right mid lung, likely atelectasis. No displaced fracture IMPRESSION: Low lung volumes without evidence of acute cardiopulmonary disease Electronically Signed   By: Gilmer MorJaime  Wagner D.O.   On: 12/17/2016 13:00    Procedures Procedures (including critical care time)  Medications Ordered in ED Medications  acetaminophen (TYLENOL) 325 MG tablet (not administered)  penicillin g benzathine (BICILLIN LA) 1200000 UNIT/2ML injection 1.2 Million Units (not administered)  acetaminophen (TYLENOL) tablet 650 mg (650 mg Oral Given 12/17/16 0900)  sodium chloride 0.9 % bolus 1,000 mL (1,000 mLs Intravenous New Bag/Given 12/17/16 1207)  ketorolac (TORADOL) 30 MG/ML injection 30 mg (30 mg Intravenous Given 12/17/16 1207)     Initial Impression / Assessment and Plan / ED Course  I have reviewed the triage vital signs and the nursing notes.  Pertinent labs & imaging results that were available during my care of the patient were reviewed by me and considered in my medical decision making (see chart for details).  Pt is feeling better after IVFs.    Pt is + for strep, so I gave him a choice of bicillin LA IM or oral amox.  He chose the shot.  He also has hematuria.  He has no other features of poststreptococcal glomerulonephritis (htn, edema, renal abn), so I do not think this is what he has, although it could me a mild case.   The pt is told the etiology of this is not certain and he needs to establish pcp and f/u with  urology.  Final Clinical Impressions(s) / ED Diagnoses   Final diagnoses:  Strep pharyngitis  Fever, unspecified fever cause  Hematuria, unspecified type    New Prescriptions New Prescriptions   HYDROCODONE-ACETAMINOPHEN (NORCO/VICODIN) 5-325 MG TABLET    Take 1 tablet by mouth every 4 (four) hours as needed.   IBUPROFEN (ADVIL,MOTRIN) 600 MG TABLET    Take 1 tablet (600 mg total) by mouth every 6 (six) hours as needed.     Jacalyn LefevreHaviland, Raney Koeppen, MD 12/17/16 [phone]

## 2016-12-17 NOTE — ED Triage Notes (Signed)
Pt in c/o fever & generalized body aches with headache, pt c/o weakness, pt reported by wife to have fever from 102-105, pt denies n/v, pt reports x 2 episodes of diarrhea yesterday

## 2016-12-17 NOTE — ED Notes (Signed)
Patient transported to X-ray 

## 2016-12-18 ENCOUNTER — Encounter (HOSPITAL_COMMUNITY): Payer: Self-pay | Admitting: Emergency Medicine

## 2016-12-18 ENCOUNTER — Emergency Department (HOSPITAL_COMMUNITY)
Admission: EM | Admit: 2016-12-18 | Discharge: 2016-12-18 | Disposition: A | Discharge status: Home / Self Care | Payer: Self-pay | Attending: Emergency Medicine | Admitting: Emergency Medicine

## 2016-12-18 DIAGNOSIS — Y9289 Other specified places as the place of occurrence of the external cause: Secondary | ICD-10-CM | POA: Insufficient documentation

## 2016-12-18 DIAGNOSIS — J020 Streptococcal pharyngitis: Secondary | ICD-10-CM | POA: Insufficient documentation

## 2016-12-18 DIAGNOSIS — R319 Hematuria, unspecified: Secondary | ICD-10-CM

## 2016-12-18 DIAGNOSIS — Y9389 Activity, other specified: Secondary | ICD-10-CM | POA: Insufficient documentation

## 2016-12-18 DIAGNOSIS — Y990 Civilian activity done for income or pay: Secondary | ICD-10-CM | POA: Insufficient documentation

## 2016-12-18 DIAGNOSIS — S838X1A Sprain of other specified parts of right knee, initial encounter: Secondary | ICD-10-CM | POA: Insufficient documentation

## 2016-12-18 DIAGNOSIS — W19XXXA Unspecified fall, initial encounter: Secondary | ICD-10-CM | POA: Insufficient documentation

## 2016-12-18 LAB — CBC WITH DIFFERENTIAL/PLATELET
Basophils Absolute: 0 10*3/uL (ref 0.0–0.1)
Basophils Relative: 0 %
EOS PCT: 0 %
Eosinophils Absolute: 0 10*3/uL (ref 0.0–0.7)
HEMATOCRIT: 39.1 % (ref 39.0–52.0)
Hemoglobin: 13.3 g/dL (ref 13.0–17.0)
LYMPHS ABS: 1.5 10*3/uL (ref 0.7–4.0)
LYMPHS PCT: 12 %
MCH: 31.9 pg (ref 26.0–34.0)
MCHC: 34 g/dL (ref 30.0–36.0)
MCV: 93.8 fL (ref 78.0–100.0)
MONO ABS: 1.1 10*3/uL — AB (ref 0.1–1.0)
Monocytes Relative: 8 %
NEUTROS ABS: 10.5 10*3/uL — AB (ref 1.7–7.7)
Neutrophils Relative %: 80 %
Platelets: 168 10*3/uL (ref 150–400)
RBC: 4.17 MIL/uL — ABNORMAL LOW (ref 4.22–5.81)
RDW: 12.5 % (ref 11.5–15.5)
WBC: 13.1 10*3/uL — ABNORMAL HIGH (ref 4.0–10.5)

## 2016-12-18 LAB — COMPREHENSIVE METABOLIC PANEL
ALBUMIN: 3.9 g/dL (ref 3.5–5.0)
ALK PHOS: 69 U/L (ref 38–126)
ALT: 36 U/L (ref 17–63)
AST: 39 U/L (ref 15–41)
Anion gap: 9 (ref 5–15)
BUN: 12 mg/dL (ref 6–20)
CALCIUM: 9 mg/dL (ref 8.9–10.3)
CHLORIDE: 103 mmol/L (ref 101–111)
CO2: 24 mmol/L (ref 22–32)
CREATININE: 0.99 mg/dL (ref 0.61–1.24)
GFR calc non Af Amer: >60 mL/min (ref >60)
GLUCOSE: 153 mg/dL — AB (ref 65–99)
Potassium: 3.4 mmol/L — ABNORMAL LOW (ref 3.5–5.1)
SODIUM: 136 mmol/L (ref 135–145)
Total Bilirubin: 0.9 mg/dL (ref 0.3–1.2)
Total Protein: 7 g/dL (ref 6.5–8.1)

## 2016-12-18 LAB — PROTIME-INR
INR: 0.93
Prothrombin Time: 12.4 seconds (ref 11.4–15.2)

## 2016-12-18 LAB — I-STAT CG4 LACTIC ACID, ED: Lactic Acid, Venous: 2.02 mmol/L (ref 0.5–1.9)

## 2016-12-18 MED ORDER — AZITHROMYCIN 250 MG PO TABS: 250.0000 mg | ORAL_TABLET | Freq: Every day | ORAL | 0 refills | Status: AC

## 2016-12-18 NOTE — ED Notes (Signed)
Lactic Acid level called from lab.  Warrick Parisianavid Hinson, RN made aware of same

## 2016-12-18 NOTE — ED Notes (Signed)
Pt stable, ambulatory, states understanding of discharge instructions 

## 2016-12-18 NOTE — ED Provider Notes (Signed)
[facility]-EMERGENCY DEPT Provider Note   CSN: [id number] Arrival date & time: 12/18/16  2102  By signing my name below, I, Warren Brennan, attest that this documentation has been prepared under the direction and in the presence of Warren Brennan, Sigrid Schwebach, MD. Electronically Signed: Karren CobbleNy'Kea Brennan, ED Scribe. 12/18/16. 11:24 PM.  History   Chief Complaint Chief Complaint  Patient presents with  . multiple complain   The history is provided by the patient. A language interpreter was used [phone]([id number]).    HPI HPI Comments: Warren Brennan is a 36 y.o. male with no pertinent history, who presents to the Emergency Department for reevaluation of a persistent fever that began two days ago. He also notes an associated sore throat. Pt was seen on 7/27, and diagnosed with strep pharyngitis and he received Penicillin while in the ED. He notes his fever and sore throat are not improving.    Pt also reports having stabbing right knee pain. 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 He has been experiencing intermittent pain since. Pt notes he is ambulatory. He denies trouble swallowing, shortness of breath, cough, chest pain, abdominal pain, dysuria, back pain, rash, dizziness, or headache.   No tick bites  PMH - none Soc hx - last traveled to [place] over 6 months ago Home Medications    Prior to Admission medications   Medication Sig Start Date End Date Taking? Authorizing Provider  acetaminophen (TYLENOL) 500 MG tablet Take 1,000 mg by mouth every 6 (six) hours as needed for mild pain or moderate pain.    Yes [provider]  bismuth subsalicylate (PEPTO BISMOL) 262 MG/15ML suspension Take 30 mLs by mouth every 6 (six) hours as needed for indigestion.   Yes [provider]  HYDROcodone-acetaminophen (NORCO/VICODIN) 5-325 MG tablet Take 1 tablet by mouth every 4 (four) hours as needed. 12/17/16  Yes Jacalyn LefevreHaviland, Julie, MD  ibuprofen (ADVIL,MOTRIN) 600  MG tablet Take 1 tablet (600 mg total) by mouth every 6 (six) hours as needed. 12/17/16  Yes Jacalyn LefevreHaviland, Julie, MD   Family History No family history on file.  Social History Social History  Substance Use Topics  . Smoking status: Never Smoker  . Smokeless tobacco: Never Used  . Alcohol use No   Allergies   Patient has no known allergies.  Review of Systems Review of Systems  Constitutional: Positive for fever.  HENT: Negative for trouble swallowing.   Eyes: Negative for visual disturbance.  Respiratory: Negative for cough and shortness of breath.   Cardiovascular: Negative for chest pain.  Gastrointestinal: Negative for abdominal pain.  Genitourinary: Negative for dysuria.  Musculoskeletal: Positive for arthralgias. Negative for back pain.  Skin: Negative for rash.  Neurological: Negative for dizziness and headaches.  All other systems reviewed and are negative.  Physical Exam Updated Vital Signs BP 124/83   Pulse 74   Temp 97.8 F (36.6 C) (Oral)   Resp 16   Ht 5\' 4"  (1.626 m)   Wt 160 lb (72.6 kg)   SpO2 97%   BMI 27.46 kg/m   Physical Exam CONSTITUTIONAL: Well developed/well nourished HEAD: Normocephalic/atraumatic EYES: EOMI/PERRL ENMT: Mucous membranes moist, uvula midline, no erythema or edema, normal phonation, no drooling NECK: supple no meningeal signs, no lymphadenopathy SPINE/BACK:entire spine nontender CV: S1/S2 noted, no murmurs/rubs/gallops noted LUNGS: brief crackles in rihgt base, no distress noted ABDOMEN: soft, nontender, no rebound or guarding, bowel sounds noted throughout abdomen GU:no cva tenderness NEURO:  Pt is awake/alert/appropriate, moves all extremitiesx4.  No facial droop.   EXTREMITIES: pulses normal/equal, full ROM, mild tenderness to the medial right knee, no erythema or edema, full ROM noted.  No joint effusion.   SKIN: warm, color normal, no rash, no petechiae  PSYCH: no abnormalities of mood noted, alert and oriented to  situation  ED Treatments / Results  DIAGNOSTIC STUDIES: Oxygen Saturation is 97% on RA, adequate by my interpretation.   COORDINATION OF CARE: 11:23 PM-Discussed next steps with pt. Pt verbalized understanding and is agreeable with the plan.   Labs (all labs ordered are listed, but only abnormal results are displayed) Labs Reviewed  COMPREHENSIVE METABOLIC PANEL - Abnormal; Notable for the following:       Result Value   Potassium 3.4 (*)    Glucose, Bld 153 (*)    All other components within normal limits  CBC WITH DIFFERENTIAL/PLATELET - Abnormal; Notable for the following:    WBC 13.1 (*)    RBC 4.17 (*)    Neutro Abs 10.5 (*)    Monocytes Absolute 1.1 (*)    All other components within normal limits  I-STAT CG4 LACTIC ACID, ED - Abnormal; Notable for the following:    Lactic Acid, Venous 2.02 (*)    All other components within normal limits  CULTURE, BLOOD (ROUTINE X 2)  CULTURE, BLOOD (ROUTINE X 2)  PROTIME-INR  URINALYSIS, ROUTINE W REFLEX MICROSCOPIC   EKG  EKG Interpretation None       Radiology  Procedures Procedures (including critical care time)  Medications Ordered in ED Medications - No data to display  Initial Impression / Assessment and Plan / ED Course  I have reviewed the triage vital signs and the nursing notes.  Pertinent labs  results that were available during my care of the patient were reviewed by me and considered in my medical decision making (see chart for details).    His main issue is persistent fever, which is likely still present from strep  Low suspicion for sepsis Pt well appearing, watching TV No distress He is handling secretions For his pharyngitis, long discussion was had with patient/spouse and also via interpreter Will give Rx for zpack if symptoms don't improve in 48 hours For his knee- likely sprain, low suspicion for fracture, referred to ortho Discussed need for PCP eval and also recheck of urinalysis due to  recent hematuria Pt/family agreeable Discussed at length strict return precautions via interpreter   Final Clinical Impressions(s) / ED Diagnoses   Final diagnoses:  Strep pharyngitis  Sprain of other ligament of right knee, initial encounter  Hematuria, unspecified type   New Prescriptions Discharge Medication List as of 12/18/2016 11:37 PM    START taking these medications   Details  azithromycin (ZITHROMAX) 250 MG tablet Take 1 tablet (250 mg total) by mouth daily. Take first 2 tablets together, then 1 every day until finished., Starting Sat 12/18/2016, Print      I personally performed the services described in this documentation, which was scribed in my presence. The recorded information has been reviewed and is accurate.        Warren Brennan, Larah Kuntzman, MD 12/19/16 [phone]

## 2016-12-18 NOTE — ED Triage Notes (Signed)
Pt was seen yesterday for fever and sore throat, got a shot of penicillin and sent home, pt still having intermittent fever, generalized body ache and left knee pain. Pt here for reevaluation.

## 2016-12-23 LAB — CULTURE, BLOOD (ROUTINE X 2)
CULTURE: NO GROWTH
CULTURE: NO GROWTH
SPECIAL REQUESTS: ADEQUATE
SPECIAL REQUESTS: ADEQUATE

## 2016-12-29 ENCOUNTER — Ambulatory Visit (INDEPENDENT_AMBULATORY_CARE_PROVIDER_SITE_OTHER): Payer: Self-pay | Admitting: Physician Assistant

## 2016-12-29 ENCOUNTER — Encounter (INDEPENDENT_AMBULATORY_CARE_PROVIDER_SITE_OTHER): Payer: Self-pay | Admitting: Physician Assistant

## 2016-12-29 VITALS — BP 122/81 | HR 80 | Temp 98.5°F | Ht 61.0 in | Wt 158.4 lb

## 2016-12-29 DIAGNOSIS — R319 Hematuria, unspecified: Secondary | ICD-10-CM

## 2016-12-29 LAB — POCT URINALYSIS DIPSTICK
BILIRUBIN UA: NEGATIVE
GLUCOSE UA: NEGATIVE
Ketones, UA: NEGATIVE
LEUKOCYTES UA: NEGATIVE
NITRITE UA: NEGATIVE
PH UA: 6 (ref 5.0–8.0)
Protein, UA: NEGATIVE
Spec Grav, UA: 1.025 (ref 1.010–1.025)
UROBILINOGEN UA: 0.2 U/dL

## 2016-12-29 NOTE — Progress Notes (Signed)
Subjective:  Patient ID: Warren DuvalAlex Brennan, male    DOB: 1980/07/12  Age: 36 y.o. MRN: [id number]  CC: hospital f/u   HPI Warren Brennan is a 36 y.o. male with no significant medical history presents on f/u from ED. ED visit on 7.27.18 for fever. Has found to have strep throat and hematuria. States his son was sick with strep throat. ED doctor treated with IM and PO abx. Pt advised to f/u with PCP in regards to hematuria. ED UA found small blood and 1+ protein on dipstick. Patient has been asymptomatic since ED discharge. Reportedly took all his abx. Denies dysuria, hematuria, urinary frequency, oliguria, anuria, back pain, f/c/n/v, rash, CP, SOB, HA, or abdominal pain.    Outpatient Medications Prior to Visit  Medication Sig Dispense Refill  . acetaminophen (TYLENOL) 500 MG tablet Take 1,000 mg by mouth every 6 (six) hours as needed for mild pain or moderate pain.     Marland Kitchen. azithromycin (ZITHROMAX) 250 MG tablet Take 1 tablet (250 mg total) by mouth daily. Take first 2 tablets together, then 1 every day until finished. (Patient not taking: Reported on 12/29/2016) 6 tablet 0  . bismuth subsalicylate (PEPTO BISMOL) 262 MG/15ML suspension Take 30 mLs by mouth every 6 (six) hours as needed for indigestion.    Marland Kitchen. HYDROcodone-acetaminophen (NORCO/VICODIN) 5-325 MG tablet Take 1 tablet by mouth every 4 (four) hours as needed. (Patient not taking: Reported on 12/29/2016) 10 tablet 0  . ibuprofen (ADVIL,MOTRIN) 600 MG tablet Take 1 tablet (600 mg total) by mouth every 6 (six) hours as needed. (Patient not taking: Reported on 12/29/2016) 30 tablet 0   No facility-administered medications prior to visit.      ROS Review of Systems  Constitutional: Negative for chills, fever and malaise/fatigue.  Eyes: Negative for blurred vision.  Respiratory: Negative for shortness of breath.   Cardiovascular: Negative for chest pain and palpitations.  Gastrointestinal: Negative for abdominal pain and nausea.  Genitourinary:  Negative for dysuria and hematuria.  Musculoskeletal: Negative for joint pain and myalgias.  Skin: Negative for rash.  Neurological: Negative for tingling and headaches.  Psychiatric/Behavioral: Negative for depression. The patient is not nervous/anxious.     Objective:  BP 122/81 (BP Location: Right Arm, Patient Position: Sitting, Cuff Size: Normal)   Pulse 80   Temp 98.5 F (36.9 C) (Oral)   Ht 5\' 1"  (1.549 m)   Wt 158 lb 6.4 oz (71.8 kg)   SpO2 100%   BMI 29.93 kg/m   BP/Weight 12/29/2016 12/18/2016 12/17/2016  Systolic BP 122 114 114  Diastolic BP 81 76 74  Wt. (Lbs) 158.4 160 160  BMI 29.93 27.46 27.46      Physical Exam  Constitutional: He is oriented to person, place, and time.  Well developed, overweight, NAD, polite  HENT:  Head: Normocephalic and atraumatic.  Mouth/Throat: No oropharyngeal exudate.  Eyes: No scleral icterus.  Neck: Normal range of motion. Neck supple. No thyromegaly present.  Cardiovascular: Normal rate, regular rhythm and normal heart sounds.   Pulmonary/Chest: Effort normal and breath sounds normal.  Abdominal: Soft.  Musculoskeletal: He exhibits no edema.  Lymphadenopathy:    He has no cervical adenopathy.  Neurological: He is alert and oriented to person, place, and time. No cranial nerve deficit. Coordination normal.  Skin: Skin is warm and dry. No rash noted. No erythema. No pallor.  Psychiatric: He has a normal mood and affect. His behavior is normal. Thought content normal.  Vitals reviewed.    Assessment &  Plan:   1. Hematuria, unspecified type - Urinalysis Dipstick showed trace lysed blood and trace protein which is an improvement over ED UA findings. It is likely there was strep induced nephritis. Patient advised to return for a full physical in which we will perform one more UA to assess complete resolution of hematuria and proteinuria.      Follow-up: 4 weeks for full physical  Loletta Specter PA

## 2017-01-05 ENCOUNTER — Encounter (HOSPITAL_COMMUNITY): Payer: Self-pay

## 2017-01-05 ENCOUNTER — Emergency Department (HOSPITAL_COMMUNITY): Payer: Self-pay

## 2017-01-05 ENCOUNTER — Emergency Department (HOSPITAL_COMMUNITY)
Admission: EM | Admit: 2017-01-05 | Discharge: 2017-01-05 | Disposition: A | Discharge status: Home / Self Care | Payer: Self-pay | Attending: Emergency Medicine | Admitting: Emergency Medicine

## 2017-01-05 DIAGNOSIS — M25561 Pain in right knee: Secondary | ICD-10-CM | POA: Insufficient documentation

## 2017-01-05 DIAGNOSIS — Z79899 Other long term (current) drug therapy: Secondary | ICD-10-CM | POA: Insufficient documentation

## 2017-01-05 MED ORDER — MELOXICAM 7.5 MG PO TABS: 7.5000 mg | ORAL_TABLET | Freq: Every day | ORAL | 0 refills | Status: AC

## 2017-01-05 NOTE — Discharge Instructions (Signed)
Please read attached information regarding your condition and RICE therapy. Take meloxicam once daily as directed. Wear knee brace as directed. Follow-up with orthopedics listed below for further evaluation. Return to ED for worsening pain, additional injury, numbness, weakness, fevers, color or temperature change of joint signaling infection.

## 2017-01-05 NOTE — ED Provider Notes (Signed)
[facility]PT Provider Note   CSN: [id number] Arrival date & time: 01/05/17  1018     History   Chief Complaint Chief Complaint  Patient presents with  . Knee Pain    HPI Warren Brennan is a 36 y.o. male.  HPI Patient presents to ED for evaluation of throbbing right medial knee pain that began 1-1/2 months ago. Pain is worsened with weightbearing. Endorses some relief with ibuprofen and knee brace. Patient states that initial incident occurred 1-1/2 months ago. He was at work when he fell into a hole and landed on his right leg. Since then he has been experiencing pain. He was seen initially a few weeks ago for other complaints and was told that he had a muscle strain. He denies any previous fracture, dislocation or procedures in the area. He was unable to follow up with orthopedics. Patient denies any fevers, chills, nausea, vomiting, history of septic joint, color change and joint, temperature change in joint, additional injury since initial accident, numbness, weakness.  History reviewed. No pertinent past medical history.  There are no active problems to display for this patient.   History reviewed. No pertinent surgical history.     Home Medications    Prior to Admission medications   Medication Sig Start Date End Date Taking? Authorizing Provider  acetaminophen (TYLENOL) 500 MG tablet Take 1,000 mg by mouth every 6 (six) hours as needed for mild pain or moderate pain.     [provider]  azithromycin (ZITHROMAX) 250 MG tablet Take 1 tablet (250 mg total) by mouth daily. Take first 2 tablets together, then 1 every day until finished. Patient not taking: Reported on 12/29/2016 12/18/16   Zadie Rhine, MD  bismuth subsalicylate (PEPTO BISMOL) 262 MG/15ML suspension Take 30 mLs by mouth every 6 (six) hours as needed for indigestion.    [provider]  HYDROcodone-acetaminophen (NORCO/VICODIN) 5-325 MG tablet Take 1 tablet by mouth every 4 (four)  hours as needed. Patient not taking: Reported on 12/29/2016 12/17/16   Jacalyn Lefevre, MD  ibuprofen (ADVIL,MOTRIN) 600 MG tablet Take 1 tablet (600 mg total) by mouth every 6 (six) hours as needed. Patient not taking: Reported on 12/29/2016 12/17/16   Jacalyn Lefevre, MD  meloxicam (MOBIC) 7.5 MG tablet Take 1 tablet (7.5 mg total) by mouth daily. 01/05/17   Dietrich Pates, PA-C    Family History History reviewed. No pertinent family history.  Social History Social History  Substance Use Topics  . Smoking status: Never Smoker  . Smokeless tobacco: Never Used  . Alcohol use No     Allergies   Patient has no known allergies.   Review of Systems Review of Systems  Constitutional: Negative for chills and fever.  Gastrointestinal: Negative for nausea and vomiting.  Musculoskeletal: Positive for arthralgias. Negative for joint swelling and myalgias.  Skin: Negative for color change, rash and wound.     Physical Exam Updated Vital Signs BP 115/80 (BP Location: Right Arm)   Pulse 63   Temp 98 F (36.7 C) (Oral)   Resp 17   Ht 5\' 1"  (1.549 m)   Wt 71.7 kg (158 lb)   SpO2 99%   BMI 29.85 kg/m   Physical Exam  Constitutional: He appears well-developed and well-nourished. No distress.  Nontoxic appearing and in no acute distress. Resting comfortably on exam table.  HENT:  Head: Normocephalic and atraumatic.  Eyes: Conjunctivae and EOM are normal. No scleral icterus.  Neck: Normal range of motion.  Pulmonary/Chest: Effort  normal. No respiratory distress.  Musculoskeletal: Normal range of motion. He exhibits tenderness. He exhibits no edema or deformity.       Legs: Tenderness to palpation on the right lateral knee. Able to perform straight leg raise.No color change noted. No temperature changes noted. No visible swelling noted. Full active and passive range of motion of the knee. Strength 5/5 against resistance. Sensation intact to light touch.  Neurological: He is alert.  Skin:  No rash noted. He is not diaphoretic.  Psychiatric: He has a normal mood and affect.  Nursing note and vitals reviewed.    ED Treatments / Results  Labs (all labs ordered are listed, but only abnormal results are displayed) Labs Reviewed - No data to display  EKG  EKG Interpretation None       Radiology Dg Knee Complete 4 Views Right  Result Date: 01/05/2017 CLINICAL DATA:  Pain for 1 month.  Fall 1 month prior EXAM: RIGHT KNEE - COMPLETE 4+ VIEW COMPARISON:  None. FINDINGS: Frontal, lateral, and bilateral oblique views were obtained. There is no fracture or dislocation. No appreciable joint effusion. Joint spaces appear normal. No erosive change peer IMPRESSION: No evident fracture or joint effusion.  No appreciable arthropathy. Electronically Signed   By: Bretta BangWilliam  Woodruff III M.D.   On: 01/05/2017 12:23    Procedures Procedures (including critical care time)  Medications Ordered in ED Medications - No data to display   Initial Impression / Assessment and Plan / ED Course  I have reviewed the triage vital signs and the nursing notes.  Pertinent labs & imaging results that were available during my care of the patient were reviewed by me and considered in my medical decision making (see chart for details).     Patient presents to ED for evaluation of right knee pain that occurred starting 1-1/2 months ago. States that he initially fell into a hole and landed on his right knee. Initial assessment several weeks ago show that he had a muscle strain in the area. He is unable to follow up with orthopedics. On physical exam there is tenderness to palpation in the medial right knee. No color, temperature change noted. No visible swelling noted. He is afebrile with no history of fever. He denies any previous fracture, dislocation or procedure in the area. He has full active and passive range of motion of the knee and normal straight leg raise. I have low suspicion for septic joint or  other infectious process being the cause of his knee pain based on appearance and symptoms. We'll obtain x-ray and reassess.  1235: x-ray shows no evident fracture, effusion or arthropathy. We'll advise patient to continue anti-inflammatories (mobic) and encouraged him to follow up with orthopedics for further evaluation. Also encouraged him to continue wearing the brace, apply RICE therapy with instructions given. Patient appears stable for discharge at this time. Strict return precautions given.   Final Clinical Impressions(s) / ED Diagnoses   Final diagnoses:  Right knee pain, unspecified chronicity    New Prescriptions New Prescriptions   MELOXICAM (MOBIC) 7.5 MG TABLET    Take 1 tablet (7.5 mg total) by mouth daily.     Dietrich PatesKhatri, Roselia Snipe, PA-C 01/05/17 1241    Arby BarrettePfeiffer, Marcy, MD 01/13/17 [phone]

## 2017-01-05 NOTE — ED Triage Notes (Signed)
Pt endorses right knee pain x 1 month. Pt fell in a hole at work 1 month ago and has had pain since. Ambulatory.

## 2017-11-18 IMAGING — CR DG KNEE COMPLETE 4+V*R*
4 series · 4 of 4 positions shown · non-contrast
Comparison: None.

CLINICAL DATA: Pain for 1 month.  Fall 1 month prior

EXAM:
RIGHT KNEE - COMPLETE 4+ VIEW

[knee ap]
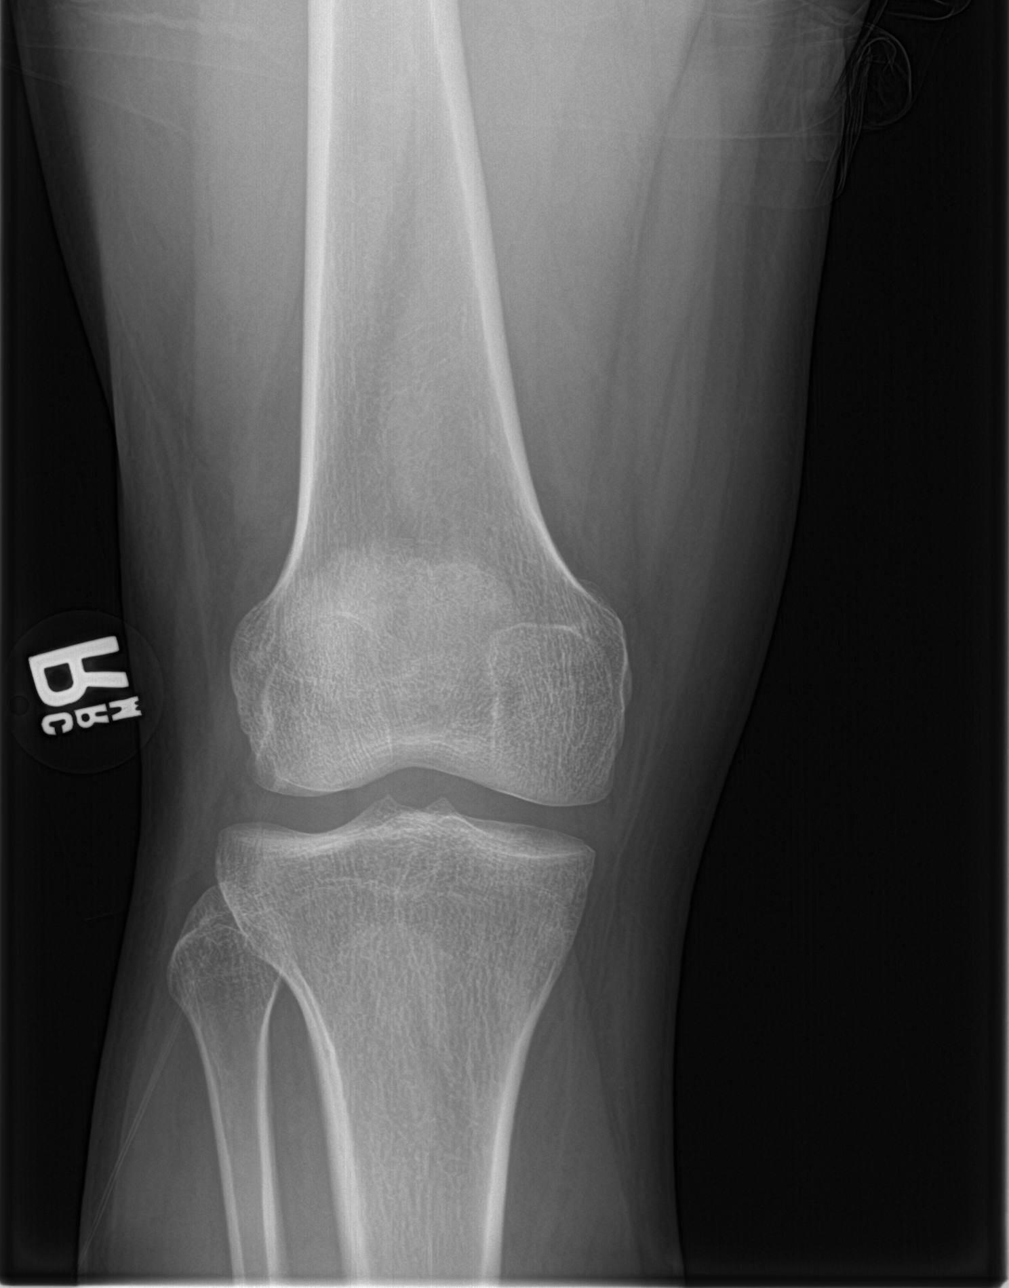

[knee lat]
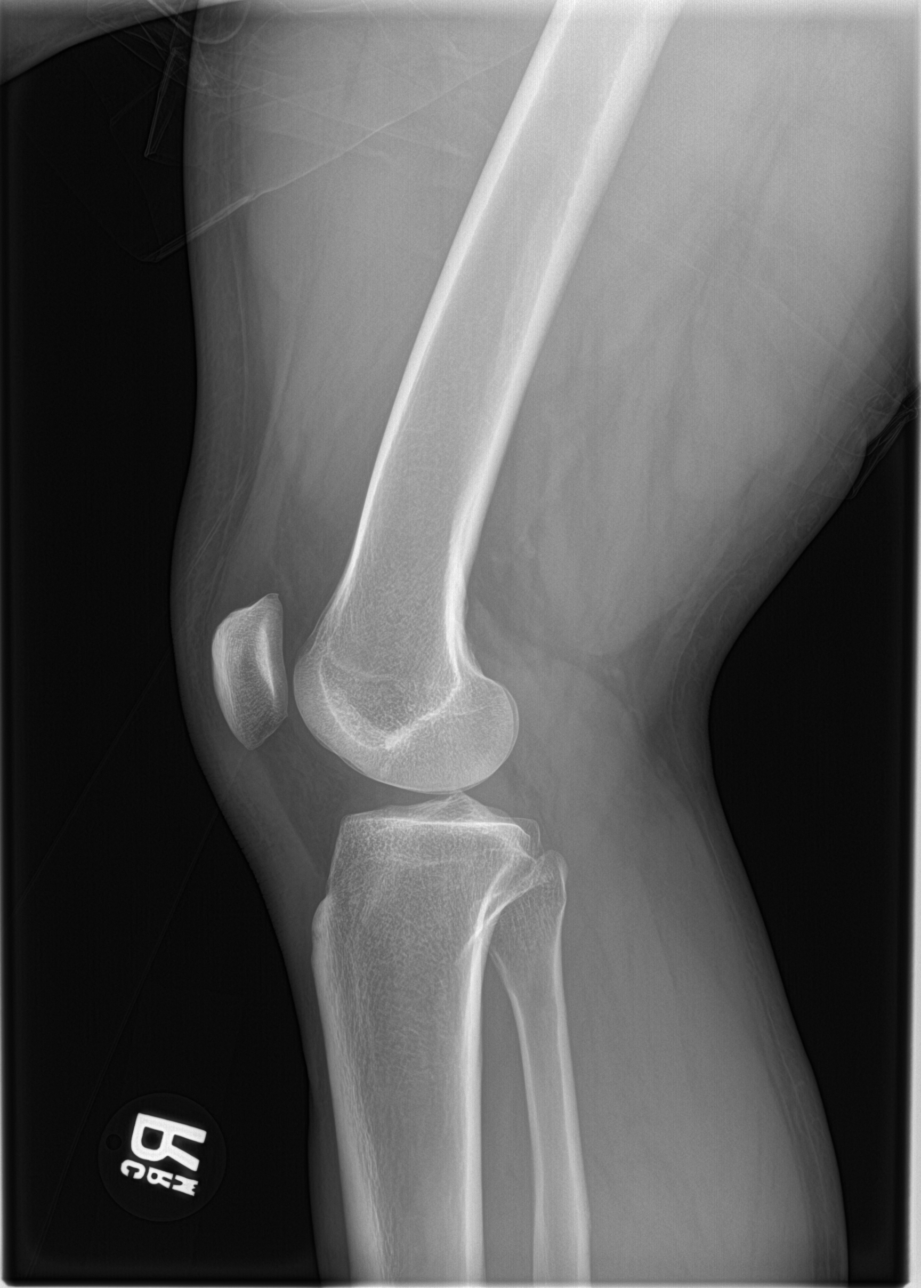

[knee obl (1 of 2)]
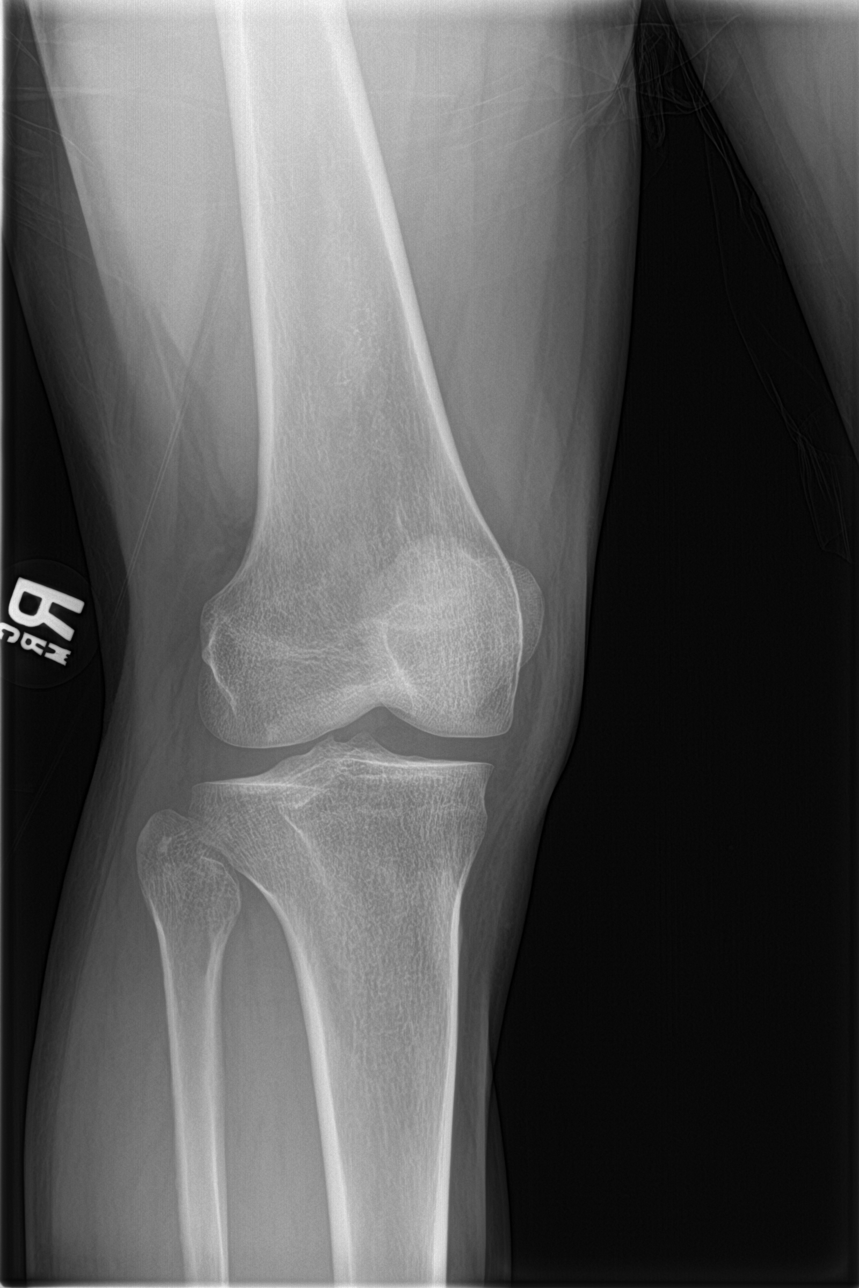

[knee obl (2 of 2)]
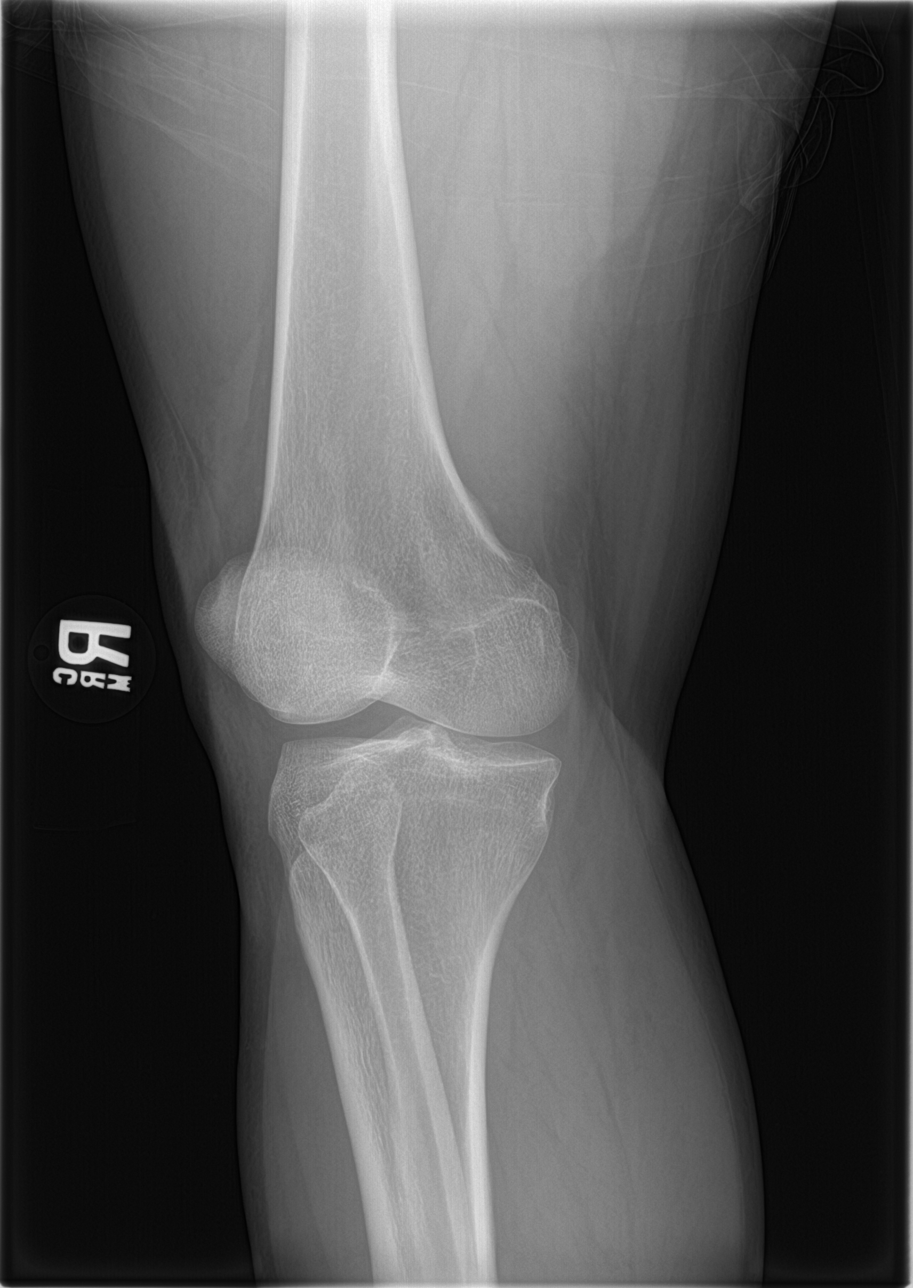

[4 of 4 positions shown; findings below may reference images not displayed]

FINDINGS: Frontal, lateral, and bilateral oblique views were obtained. There
is no fracture or dislocation. No appreciable joint effusion. Joint
spaces appear normal. No erosive change peer
IMPRESSION: No evident fracture or joint effusion.  No appreciable arthropathy.
